# Patient Record
Sex: Male | Born: 1995 | Race: White | Hispanic: No | Marital: Single | Smoking: Never smoker
Health system: Southern US, Community
[De-identification: ages and names within clinical notes are randomized; demographics above are authoritative.]

---
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
## 2016-09-06 IMAGING — CT CT ABD-PELV W/ CM
2 of 4 series · 15 of 46 positions shown, 17 images · IV contrast (APPLIED)
Comparison: None.

CLINICAL DATA: Right lower quadrant abdominal pain, nausea and
vomiting for 3-4 days.

EXAM:
CT ABDOMEN AND PELVIS WITH CONTRAST
TECHNIQUE: Multidetector CT imaging of the abdomen and pelvis was performed
using the standard protocol following bolus administration of
intravenous contrast.
CONTRAST:  80mL OMNIPAQUE IOHEXOL 300 MG/ML  SOLN

[Series 2: abd/ pelvis 5.0 i30f 1 · axial · 0.66mm/px · z∈[-422,-12]mm · 12 of 90 slices shown, 14 images]
[im 4/90  soft-tissue]
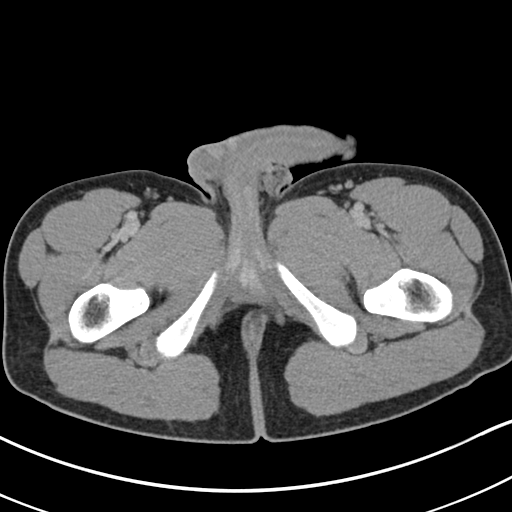
[im 4/90  bone]
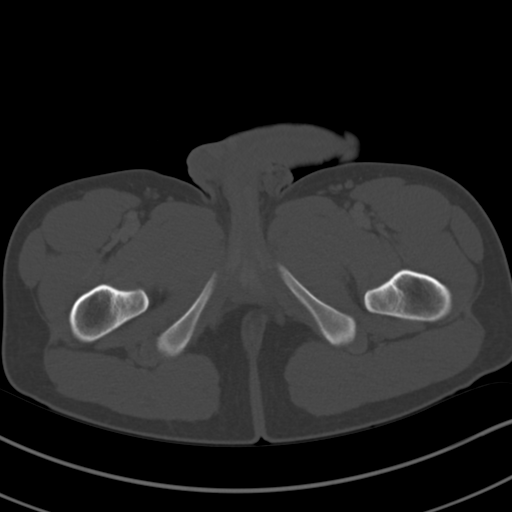
[im 12/90  soft-tissue]
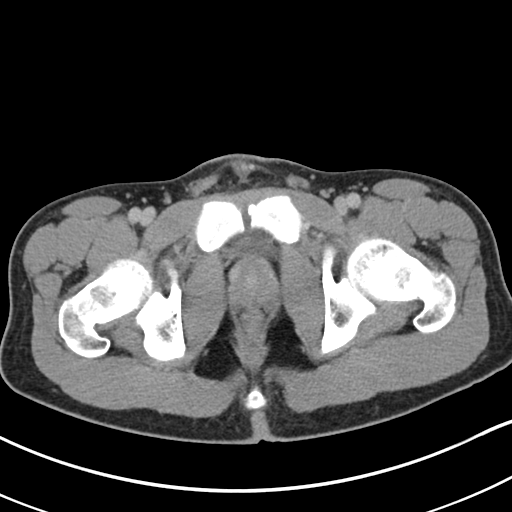
[im 20/90  soft-tissue]
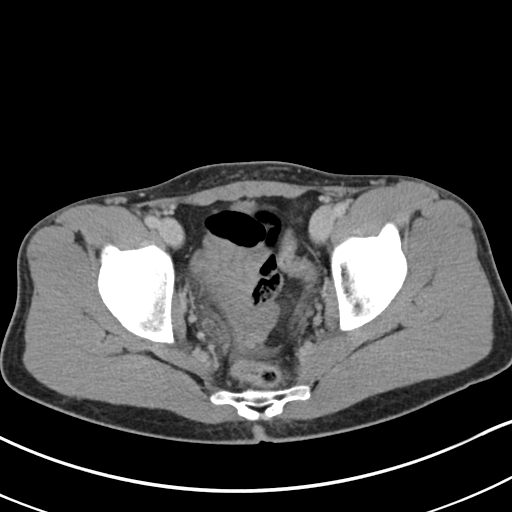
[im 28/90  soft-tissue]
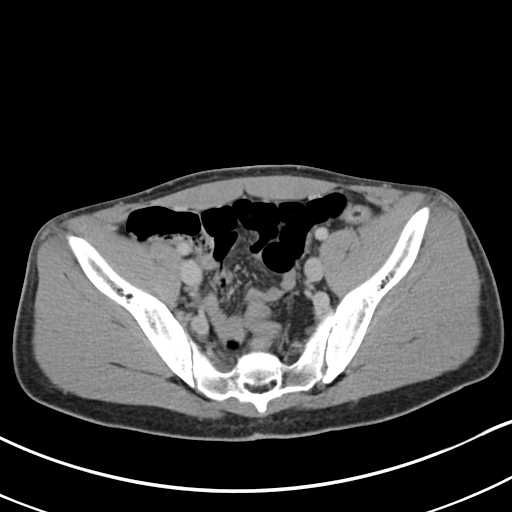
[im 35/90  soft-tissue]
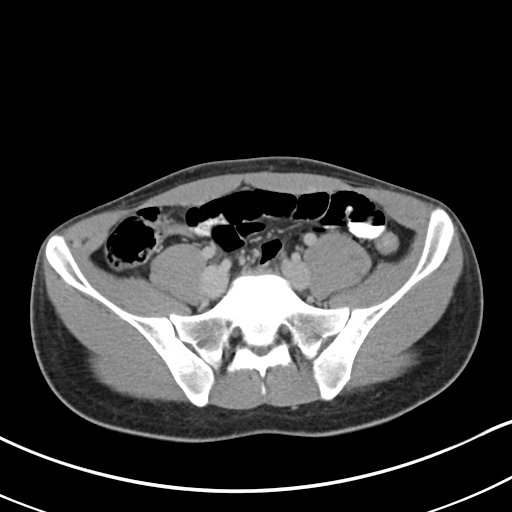
[im 43/90  soft-tissue]
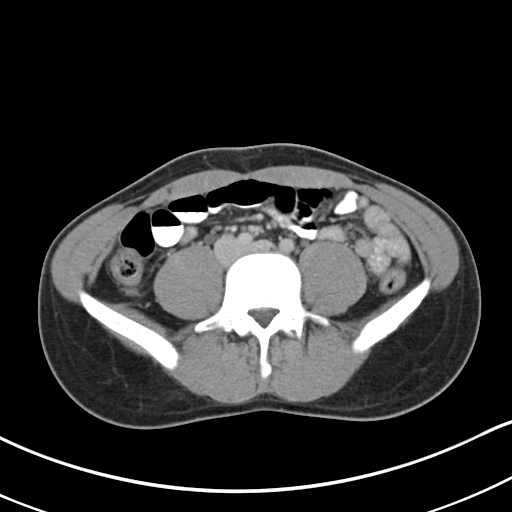
[im 47/90  soft-tissue]
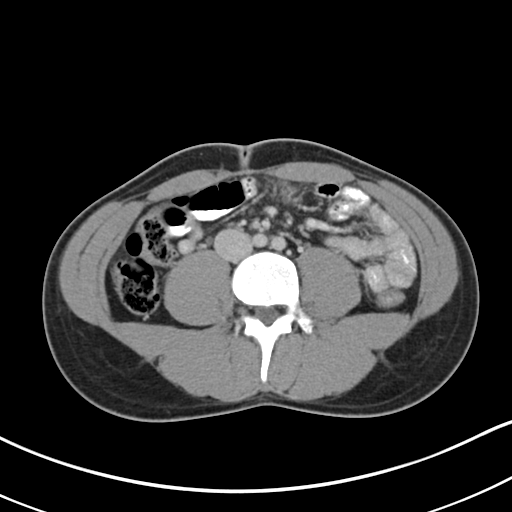
[im 55/90  soft-tissue]
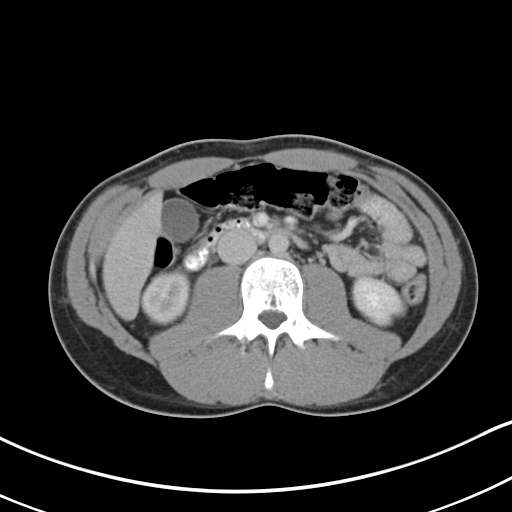
[im 62/90  soft-tissue]
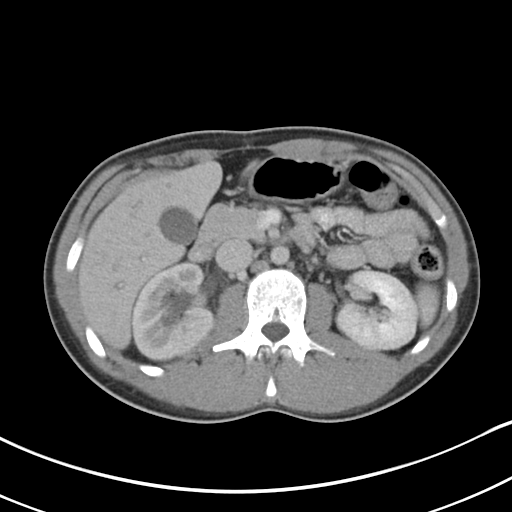
[im 62/90  bone]
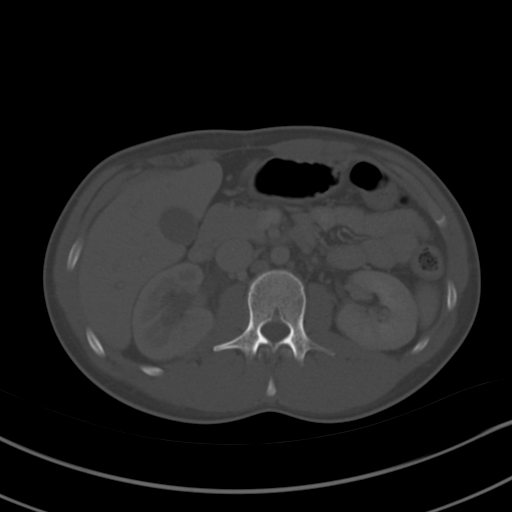
[im 70/90  soft-tissue]
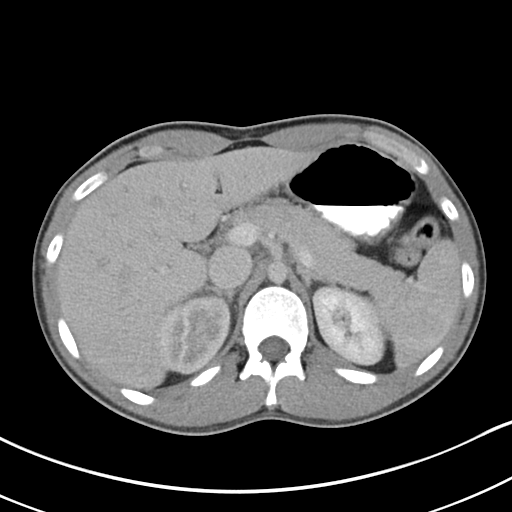
[im 78/90  soft-tissue]
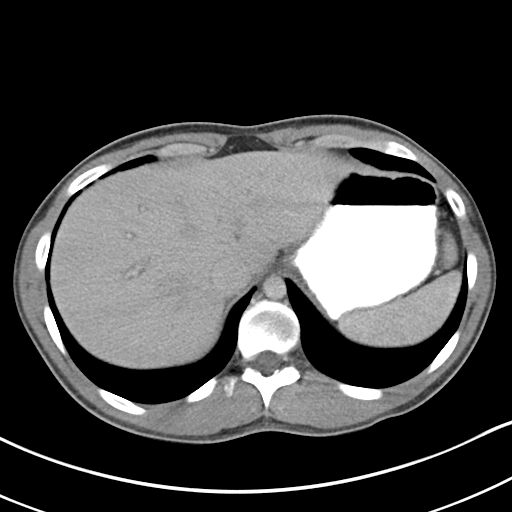
[im 86/90  soft-tissue]
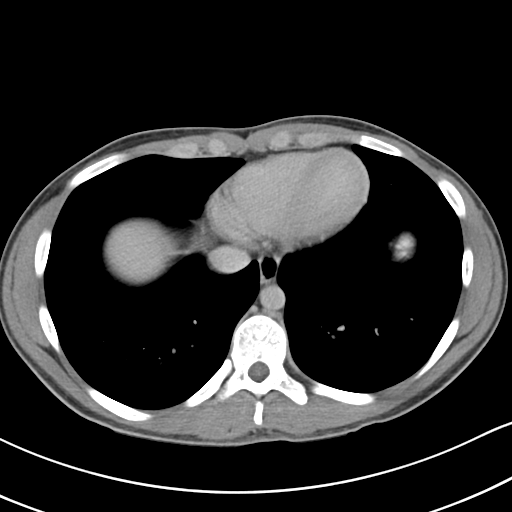

[Series 5: coronal soft tissue · coronal · 0.71mm/px · 3 of 69 slices shown]
[im 23/69  soft-tissue]
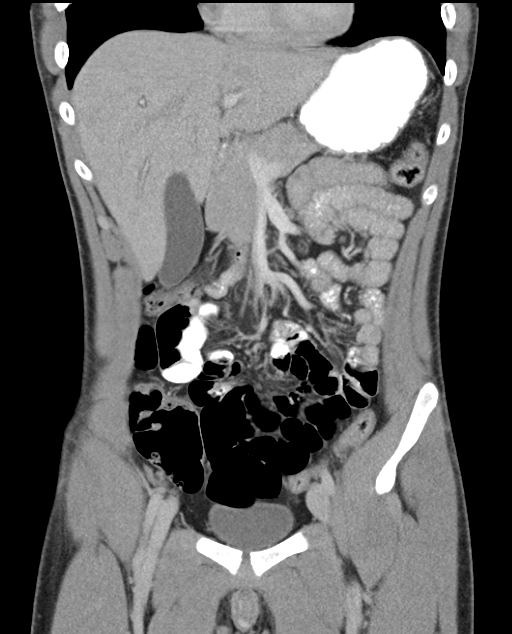
[im 31/69  soft-tissue]
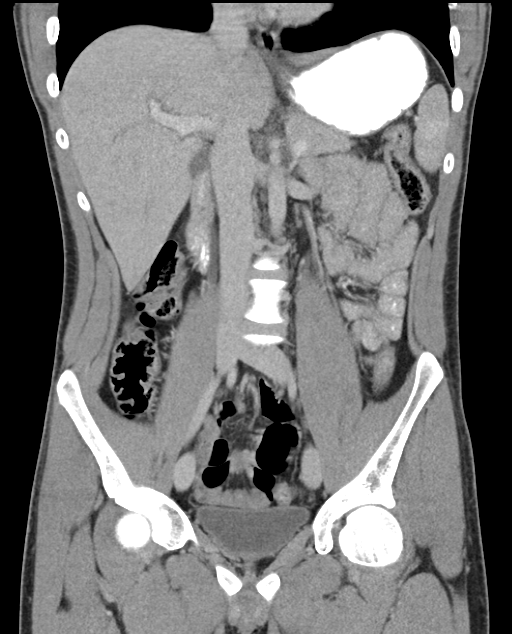
[im 38/69  soft-tissue]
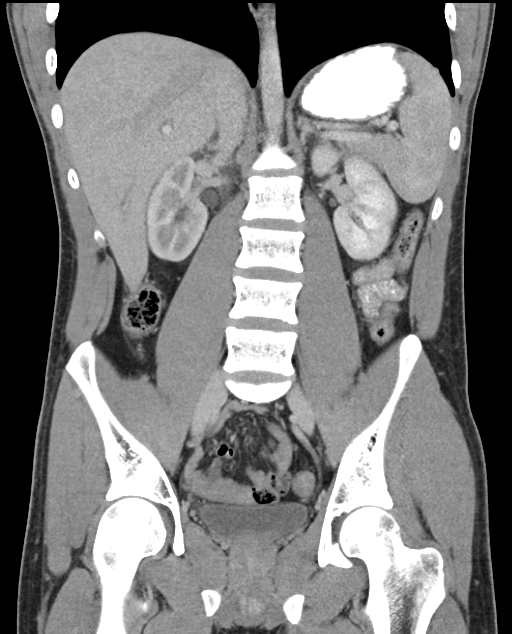

[15 of 46 positions shown; findings below may reference images not displayed]

FINDINGS: There is a punctate (approximately 4 mm) stone within the distal
aspect of the right ureter regional to the right UVJ (axial image
73, series 2; coronal image 46, series 5) which results in mild
upstream ureterectasis and pelvicaliectasis. No additional renal
stones are seen. No evidence of left-sided urinary obstruction. No
discrete renal lesions. Normal appearance of the urinary bladder
given degree distention.

Normal hepatic contour. There is a minimal amount of periportal
edema, potentially accentuated due to phase of enhancement. No
discrete hepatic lesions. Normal appearance of the gallbladder. No
radiopaque gallstones. No intra extrahepatic biliary duct
dilatation. No ascites.

Normal appearance of the bilateral adrenal glands, pancreas and
spleen.

Ingested enteric contrast extends to the level of the mid small
bowel. The bowel is normal in course and caliber without wall
thickening or evidence of obstruction. Normal appearance of the
retrocecal appendix. No pneumoperitoneum, pneumatosis or portal
venous gas.

Normal caliber of the abdominal aorta. The major branch vessels of
the abdominal aorta appear widely patent on this non CTA
examination. No bulky retroperitoneal, mesenteric, pelvic or
inguinal lymphadenopathy.

Normal appearance of the pelvic organs. There is a minimal amount of
reactive free fluid within the pelvic cul-de-sac (image 73, series
2).

Limited visualization the lower thorax is negative for focal
airspace opacity or pleural effusion. Normal heart size. No
pericardial effusion.

No acute or aggressive osseous abnormalities.

Regional soft tissues appear normal.
IMPRESSION: 1. Punctate (approximately 4 mm) nonobstructing stone within the
distal aspect of the right ureter regional to the right UVJ results
in mild upstream ureterectasis and pelvicaliectasis.
2. No additional renal stones are seen. No evidence of left-sided
urinary obstruction.
3. No evidence of enteric obstruction. Normal appearance of the
retrocecal appendix.
4. Minimal amount of reactive periportal edema and fluid within the
pelvic cul-de-sac.
